# Patient Record
Sex: Female | Born: 2005 | Race: White | Hispanic: No | Marital: Single | Smoking: Never smoker
Health system: Southern US, Community
[De-identification: ages and names within clinical notes are randomized; demographics above are authoritative.]

---
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
## 2023-01-03 DIAGNOSIS — M25572 Pain in left ankle and joints of left foot: Secondary | ICD-10-CM | POA: Diagnosis not present

## 2023-01-03 DIAGNOSIS — S93622A Sprain of tarsometatarsal ligament of left foot, initial encounter: Secondary | ICD-10-CM | POA: Diagnosis not present

## 2023-01-27 DIAGNOSIS — Z01818 Encounter for other preprocedural examination: Secondary | ICD-10-CM | POA: Diagnosis not present

## 2023-01-27 DIAGNOSIS — Z0000 Encounter for general adult medical examination without abnormal findings: Secondary | ICD-10-CM | POA: Diagnosis not present
